# Patient Record
Sex: Female | Born: 1945 | Marital: Married | Smoking: Never smoker
Health system: Southern US, Community
[De-identification: ages and names within clinical notes are randomized; demographics above are authoritative.]

## PROBLEM LIST (undated history)

## (undated) DIAGNOSIS — H409 Unspecified glaucoma: Secondary | ICD-10-CM

## (undated) DIAGNOSIS — H3530 Unspecified macular degeneration: Secondary | ICD-10-CM

## (undated) DIAGNOSIS — M1990 Unspecified osteoarthritis, unspecified site: Secondary | ICD-10-CM

## (undated) HISTORY — PX: COLONOSCOPY: SHX174

## (undated) HISTORY — PX: EYE SURGERY: SHX253

---

## 2004-12-28 ENCOUNTER — Ambulatory Visit: Payer: Self-pay | Admitting: Unknown Physician Specialty

## 2006-03-14 ENCOUNTER — Ambulatory Visit: Payer: Self-pay | Admitting: Unknown Physician Specialty

## 2006-03-20 ENCOUNTER — Ambulatory Visit: Payer: Self-pay | Admitting: Unknown Physician Specialty

## 2006-04-05 ENCOUNTER — Ambulatory Visit: Payer: Self-pay | Admitting: Gastroenterology

## 2006-09-23 ENCOUNTER — Ambulatory Visit: Payer: Self-pay | Admitting: Unknown Physician Specialty

## 2007-03-25 ENCOUNTER — Ambulatory Visit: Payer: Self-pay | Admitting: Unknown Physician Specialty

## 2007-11-28 ENCOUNTER — Ambulatory Visit: Payer: Self-pay | Admitting: Family Medicine

## 2008-02-06 ENCOUNTER — Ambulatory Visit: Payer: Self-pay | Admitting: Family Medicine

## 2008-03-25 ENCOUNTER — Ambulatory Visit: Payer: Self-pay | Admitting: Unknown Physician Specialty

## 2009-03-31 ENCOUNTER — Ambulatory Visit: Payer: Self-pay | Admitting: Unknown Physician Specialty

## 2010-04-05 ENCOUNTER — Ambulatory Visit: Payer: Self-pay | Admitting: Unknown Physician Specialty

## 2011-04-12 ENCOUNTER — Ambulatory Visit: Payer: Self-pay | Admitting: Unknown Physician Specialty

## 2011-05-21 ENCOUNTER — Ambulatory Visit: Payer: Self-pay | Admitting: Gastroenterology

## 2011-05-24 LAB — PATHOLOGY REPORT

## 2011-08-31 ENCOUNTER — Ambulatory Visit: Payer: Self-pay | Admitting: Ophthalmology

## 2011-08-31 DIAGNOSIS — I499 Cardiac arrhythmia, unspecified: Secondary | ICD-10-CM

## 2011-09-10 ENCOUNTER — Ambulatory Visit: Payer: Self-pay | Admitting: Ophthalmology

## 2012-06-11 ENCOUNTER — Ambulatory Visit: Payer: Self-pay | Admitting: Ophthalmology

## 2012-06-23 ENCOUNTER — Ambulatory Visit: Payer: Self-pay | Admitting: Ophthalmology

## 2015-02-22 NOTE — Op Note (Signed)
PATIENT NAME:  Traci Reyes, Traci Reyes MR#:  [id number] DATE OF BIRTH:  February 01, 1946  DATE OF PROCEDURE:  06/23/2012  PREOPERATIVE DIAGNOSIS:  Cataract, right eye.  POSTOPERATIVE DIAGNOSIS:  Cataract, right eye.  PROCEDURE PERFORMED:  Extracapsular cataract extraction using phacoemulsification with placement of an Alcon [id number], 18.5-diopter posterior chamber lens, serial # [id number].  SURGEON:  Loura Back. Langley Ingalls, MD  ASSISTANT:  None.  ANESTHESIA:  4% lidocaine and 0.75% Marcaine in a 50/50 mixture with 10 units/mL of Hylenex added, given as a peribulbar.  ANESTHESIOLOGIST:  Dr. Benjamine Mola  COMPLICATIONS:  None.  ESTIMATED BLOOD LOSS:  Less than 1 mL.  DESCRIPTION OF PROCEDURE:  The patient was brought to the operating room and given a peribulbar block.  The patient was then prepped and draped in the usual fashion.  The vertical rectus muscles were imbricated using 5-0 silk sutures.  These sutures were then clamped to the sterile drapes as bridle sutures.  A limbal peritomy was performed extending two clock hours and hemostasis was obtained with cautery.  A partial thickness scleral groove was made at the surgical limbus and then dissected anteriorly in a lamellar dissection with using an Alcon crescent knife.  The anterior chamber was entered superonasally with a Superblade and through the lamellar dissection with a 2.6-mm keratome.  DisCoVisc was used to replace the aqueous and a continuous tear capsulorrhexis was carried out.  Hydrodissection and hydrodelineation were carried out with balanced salt and a 27 gauge canula.  The nucleus was rotated to confirm the effectiveness of the hydrodissection.  Phacoemulsification was carried out using a divide-and-conquer technique.  Total ultrasound time was 1 minute and 36.2 seconds with an average power of 17.8 percent. CDE 34.16.  Irrigation/aspiration was used to remove the residual cortex.  DisCoVisc was used to inflate the capsule and the internal  wound was enlarged to 3 mm with the crescent knife.  The intraocular lens was inserted into the capsular bag using the AcrySert delivery system.  Irrigation/aspiration was used to remove the residual DisCoVisc.  Miostat was injected into the anterior chamber through the paracentesis track to inflate the anterior chamber and induce miosis.  The wound was checked for leaks and wound leakage was found.  A single 10-0 suture was placed across the wound, tied and the knot was rotated superiorly.  The conjunctiva was closed with cautery and the bridle sutures were removed.  Two drops of 0.3% Vigamox were placed on the eye.  An eye shield was placed on the eye.  The patient was discharged to the recovery room in good condition.  ____________________________ Loura Back Charrise Lardner, MD sad:cms D: 06/23/2012 13:46:23 ET T: 06/23/2012 14:07:16 ET JOB#: [id number]  cc: Remo Lipps A. Kaikoa Magro, MD, <Dictator> Martie Lee MD ELECTRONICALLY SIGNED 06/30/2012 13:43

## 2016-07-20 DIAGNOSIS — D126 Benign neoplasm of colon, unspecified: Secondary | ICD-10-CM | POA: Insufficient documentation

## 2016-08-20 ENCOUNTER — Other Ambulatory Visit: Payer: Self-pay | Admitting: Family Medicine

## 2016-08-20 DIAGNOSIS — Z1231 Encounter for screening mammogram for malignant neoplasm of breast: Secondary | ICD-10-CM

## 2016-09-17 ENCOUNTER — Ambulatory Visit
Admission: RE | Admit: 2016-09-17 | Discharge: 2016-09-17 | Disposition: A | Discharge status: Home / Self Care | Payer: Medicare Other | Source: Ambulatory Visit | Attending: Family Medicine | Admitting: Family Medicine

## 2016-09-17 ENCOUNTER — Encounter: Payer: Self-pay | Admitting: Radiology

## 2016-09-17 DIAGNOSIS — Z1231 Encounter for screening mammogram for malignant neoplasm of breast: Secondary | ICD-10-CM | POA: Diagnosis not present

## 2016-11-08 ENCOUNTER — Encounter: Payer: Self-pay | Admitting: *Deleted

## 2016-11-09 ENCOUNTER — Ambulatory Visit: Payer: Medicare Other | Admitting: Anesthesiology

## 2016-11-09 ENCOUNTER — Ambulatory Visit
Admission: RE | Admit: 2016-11-09 | Discharge: 2016-11-09 | Disposition: A | Discharge status: Home / Self Care | Payer: Medicare Other | Source: Ambulatory Visit | Attending: Gastroenterology | Admitting: Gastroenterology

## 2016-11-09 ENCOUNTER — Encounter
Admission: RE | Disposition: A | Discharge status: Home / Self Care | Payer: Self-pay | Source: Ambulatory Visit | Attending: Gastroenterology

## 2016-11-09 DIAGNOSIS — Z7982 Long term (current) use of aspirin: Secondary | ICD-10-CM | POA: Diagnosis not present

## 2016-11-09 DIAGNOSIS — K5730 Diverticulosis of large intestine without perforation or abscess without bleeding: Secondary | ICD-10-CM | POA: Diagnosis not present

## 2016-11-09 DIAGNOSIS — K640 First degree hemorrhoids: Secondary | ICD-10-CM | POA: Insufficient documentation

## 2016-11-09 DIAGNOSIS — D122 Benign neoplasm of ascending colon: Secondary | ICD-10-CM | POA: Insufficient documentation

## 2016-11-09 DIAGNOSIS — M1990 Unspecified osteoarthritis, unspecified site: Secondary | ICD-10-CM | POA: Diagnosis not present

## 2016-11-09 DIAGNOSIS — Z1211 Encounter for screening for malignant neoplasm of colon: Secondary | ICD-10-CM | POA: Diagnosis present

## 2016-11-09 DIAGNOSIS — Z86010 Personal history of colonic polyps: Secondary | ICD-10-CM | POA: Insufficient documentation

## 2016-11-09 DIAGNOSIS — H3530 Unspecified macular degeneration: Secondary | ICD-10-CM | POA: Diagnosis not present

## 2016-11-09 DIAGNOSIS — H409 Unspecified glaucoma: Secondary | ICD-10-CM | POA: Insufficient documentation

## 2016-11-09 DIAGNOSIS — Z888 Allergy status to other drugs, medicaments and biological substances status: Secondary | ICD-10-CM | POA: Diagnosis not present

## 2016-11-09 HISTORY — DX: Unspecified macular degeneration: H35.30

## 2016-11-09 HISTORY — DX: Unspecified osteoarthritis, unspecified site: M19.90

## 2016-11-09 HISTORY — DX: Unspecified glaucoma: H40.9

## 2016-11-09 HISTORY — PX: COLONOSCOPY WITH PROPOFOL: SHX5780

## 2016-11-09 SURGERY — COLONOSCOPY WITH PROPOFOL
Anesthesia: General

## 2016-11-09 MED ORDER — SODIUM CHLORIDE 0.9 % IV SOLN
INTRAVENOUS | Status: DC
  Administered 2016-11-09: 07:00:00 via INTRAVENOUS

## 2016-11-09 MED ORDER — PROPOFOL 10 MG/ML IV BOLUS
INTRAVENOUS | Status: AC
  Filled 2016-11-09: qty 20

## 2016-11-09 MED ORDER — PROPOFOL 10 MG/ML IV BOLUS
INTRAVENOUS | Status: DC | PRN
  Administered 2016-11-09: 40 mg via INTRAVENOUS

## 2016-11-09 MED ORDER — SODIUM CHLORIDE 0.9 % IV SOLN: INTRAVENOUS | Status: DC

## 2016-11-09 MED ORDER — PROPOFOL 500 MG/50ML IV EMUL
INTRAVENOUS | Status: DC | PRN
  Administered 2016-11-09: 120 ug/kg/min via INTRAVENOUS

## 2016-11-09 MED ORDER — LIDOCAINE HCL (CARDIAC) 10 MG/ML IV SOLN
INTRAVENOUS | Status: DC | PRN
  Administered 2016-11-09: 3 mL via INTRAVENOUS

## 2016-11-09 MED ORDER — ESMOLOL HCL 100 MG/10ML IV SOLN
INTRAVENOUS | Status: AC
  Filled 2016-11-09: qty 10

## 2016-11-09 MED ORDER — LIDOCAINE HCL 2 % EX GEL
CUTANEOUS | Status: AC
  Filled 2016-11-09: qty 5

## 2016-11-09 MED ORDER — ESMOLOL HCL 100 MG/10ML IV SOLN
INTRAVENOUS | Status: DC | PRN
  Administered 2016-11-09: 30 mg via INTRAVENOUS

## 2016-11-09 NOTE — Op Note (Addendum)
[facility] Patient Name: Margueritte Utterback Procedure Date: 11/09/2016 7:41 AM MRN: [id number] Account #: [id number] Date of Birth: 1946-05-28 Admit Type: Outpatient Age: 71 Room: [facility] ENDO ROOM 1 Gender: Female Note Status: Finalized Procedure:            Colonoscopy Indications:          Personal history of colonic polyps Providers:            Lollie Sails, MD Referring MD:         Youlanda Roys. Lovie Macadamia, MD (Referring MD) Medicines:            Monitored Anesthesia Care Complications:        No immediate complications. Procedure:            Pre-Anesthesia Assessment:                       - ASA Grade Assessment: II - A patient with mild                        systemic disease.                       After obtaining informed consent, the colonoscope was                        passed under direct vision. Throughout the procedure,                        the patient's blood pressure, pulse, and oxygen                        saturations were monitored continuously. The                        Colonoscope was introduced through the anus and                        advanced to the the cecum, identified by appendiceal                        orifice and ileocecal valve. The colonoscopy was                        performed with moderate difficulty due to significant                        looping and a tortuous colon. Successful completion of                        the procedure was aided by using manual pressure. The                        patient tolerated the procedure well. The quality of                        the bowel preparation was good. Findings:      A 2 mm polyp was found in the distal ascending colon. The polyp was       sessile. The polyp was removed with a cold biopsy forceps. Resection and       retrieval  were complete.      A few small-mouthed diverticula were found in the sigmoid colon and       distal descending colon.      Non-bleeding  internal hemorrhoids were found during anoscopy. The       hemorrhoids were small and Grade I (internal hemorrhoids that do not       prolapse).      The digital rectal exam was normal.      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 Impression:           - One 2 mm polyp in the distal ascending colon, removed                        with a cold biopsy forceps. Resected and retrieved.                       - Diverticulosis in the sigmoid colon and in the distal                        descending colon.                       - Non-bleeding internal hemorrhoids. Recommendation:       - Discharge patient to home.                       - Await pathology results. Procedure Code(s):    --- Professional ---                       [phone], Colonoscopy, flexible; with biopsy, single or                        multiple Diagnosis Code(s):    --- Professional ---                       D12.2, Benign neoplasm of ascending colon                       K64.0, First degree hemorrhoids                       Z86.010, Personal history of colonic polyps                       K57.30, Diverticulosis of large intestine without                        perforation or abscess without bleeding CPT copyright 2016 American Medical Association. All rights reserved. The codes documented in this report are preliminary and upon coder review may  be revised to meet current compliance requirements. Lollie Sails, MD 11/09/2016 8:23:32 AM This report has been signed electronically. Number of Addenda: 0 Note Initiated On: 11/09/2016 7:41 AM Scope Withdrawal Time: 0 hours 11 minutes 17 seconds  Total Procedure Duration: 0 hours 30 minutes 33 seconds       [facility]

## 2016-11-09 NOTE — Anesthesia Preprocedure Evaluation (Signed)
Anesthesia Evaluation  Patient identified by MRN, date of birth, ID band Patient awake    Reviewed: Allergy & Precautions, NPO status , Patient's Chart, lab work & pertinent test results  Airway Mallampati: II       Dental  (+) Teeth Intact   Pulmonary neg pulmonary ROS,    breath sounds clear to auscultation       Cardiovascular Exercise Tolerance: Good negative cardio ROS   Rhythm:Regular Rate:Tachycardia     Neuro/Psych negative neurological ROS     GI/Hepatic negative GI ROS, Neg liver ROS,   Endo/Other  negative endocrine ROS  Renal/GU negative Renal ROS     Musculoskeletal   Abdominal Normal abdominal exam  (+)   Peds negative pediatric ROS (+)  Hematology   Anesthesia Other Findings   Reproductive/Obstetrics                             Anesthesia Physical Anesthesia Plan  ASA: II  Anesthesia Plan: General   Post-op Pain Management:    Induction: Intravenous  Airway Management Planned: Natural Airway and Nasal Cannula  Additional Equipment:   Intra-op Plan:   Post-operative Plan:   Informed Consent: I have reviewed the patients History and Physical, chart, labs and discussed the procedure including the risks, benefits and alternatives for the proposed anesthesia with the patient or authorized representative who has indicated his/her understanding and acceptance.     Plan Discussed with: Surgeon  Anesthesia Plan Comments:         Anesthesia Quick Evaluation

## 2016-11-09 NOTE — Transfer of Care (Signed)
Immediate Anesthesia Transfer of Care Note  Patient: Traci Reyes  Procedure(s) Performed: Procedure(s): COLONOSCOPY WITH PROPOFOL (N/A)  Patient Location: PACU  Anesthesia Type:General  Level of Consciousness: awake  Airway & Oxygen Therapy: Patient Spontanous Breathing and Patient connected to nasal cannula oxygen  Post-op Assessment: Report given to RN and Post -op Vital signs reviewed and stable  Post vital signs: Reviewed  Last Vitals:  Vitals:   11/09/16 0658  BP: (!) 144/64  Pulse: 64  Resp: 20  Temp: (!) 35.6 C    Last Pain:  Vitals:   11/09/16 0658  TempSrc: Tympanic         Complications: No apparent anesthesia complications

## 2016-11-09 NOTE — H&P (Signed)
Outpatient short stay form Pre-procedure 11/09/2016 7:40 AM Lollie Sails MD  Primary Physician: Dr. [name]  Reason for visit:  Colonoscopy  History of present illness:  Patient is a 71 year old female presenting today as above. Colonoscopy was in 2012 that showed an adenomatous colon polyp. She tolerated her prep well. She does take a daily aspirin but has held that for at least several days. She takes no other blood thinning agents or aspirin products.    Current Facility-Administered Medications:  .  0.9 %  sodium chloride infusion, , Intravenous, Continuous, Lollie Sails, MD, Last Rate: 20 mL/hr at 11/09/16 0728 .  0.9 %  sodium chloride infusion, , Intravenous, Continuous, Lollie Sails, MD  Prescriptions Prior to Admission  Medication Sig Dispense Refill Last Dose  . aspirin EC 81 MG tablet Take 81 mg by mouth daily.   Past Week at Unknown time  . bimatoprost (LUMIGAN) 0.01 % SOLN Place 1 drop into both eyes at bedtime.   11/08/2016 at Unknown time  . Calcium Carbonate-Vitamin D (CALCIUM 500 + D) 500-125 MG-UNIT TABS Take 1 tablet by mouth daily.   Past Week at Unknown time  . Cholecalciferol 10000 units TABS Take 1 tablet by mouth daily.   Past Week at Unknown time  . Multiple Vitamins-Minerals (PRESERVISION AREDS) CAPS Take by mouth 2 (two) times daily.   11/09/2016 at Unknown time  . timolol (TIMOPTIC) 0.5 % ophthalmic solution Place 1 drop into both eyes 2 (two) times daily.   11/08/2016 at Unknown time  . dorzolamide-timolol (COSOPT) 22.3-6.8 MG/ML ophthalmic solution Place 1 drop into both eyes 2 (two) times daily.   Not Taking at Unknown time  . loratadine (CLARITIN) 10 MG tablet Take 10 mg by mouth daily.   Not Taking at Unknown time     Allergies  Allergen Reactions  . Cosopt [Dorzolamide Hcl-Timolol Mal] Swelling     Past Medical History:  Diagnosis Date  . Arthritis   . Glaucoma   . Macular degeneration disease     Review of systems:       Physical Exam    Heart and lungs: Regular rate and rhythm without rub or gallop, lungs are bilaterally clear.    HEENT: Normocephalic atraumatic eyes are anicteric    Other:     Pertinant exam for procedure: Soft nontender nondistended bowel sounds positive normoactive.    Planned proceedures: Colonoscopy I have discussed the risks benefits and complications of procedures to include not limited to bleeding, infection, perforation and the risk of sedation and the patient wishes to proceed. and indicated procedures.    Lollie Sails, MD Gastroenterology 11/09/2016  7:40 AM

## 2016-11-09 NOTE — Anesthesia Postprocedure Evaluation (Signed)
Anesthesia Post Note  Patient: Traci Reyes  Procedure(s) Performed: Procedure(s) (LRB): COLONOSCOPY WITH PROPOFOL (N/A)  Patient location during evaluation: PACU Anesthesia Type: General Level of consciousness: awake Pain management: satisfactory to patient Vital Signs Assessment: post-procedure vital signs reviewed and stable Respiratory status: spontaneous breathing Cardiovascular status: stable Anesthetic complications: no     Last Vitals:  Vitals:   11/09/16 0840 11/09/16 0850  BP: 133/88 126/66  Pulse:  (!) 59  Resp:  (!) 26  Temp:      Last Pain:  Vitals:   11/09/16 0820  TempSrc: Tympanic                 VAN STAVEREN,Melquiades Kovar

## 2016-11-12 LAB — SURGICAL PATHOLOGY

## 2017-08-19 ENCOUNTER — Other Ambulatory Visit: Payer: Self-pay | Admitting: Family Medicine

## 2017-08-19 DIAGNOSIS — Z1231 Encounter for screening mammogram for malignant neoplasm of breast: Secondary | ICD-10-CM

## 2017-09-25 ENCOUNTER — Ambulatory Visit
Admission: RE | Admit: 2017-09-25 | Discharge: 2017-09-25 | Disposition: A | Discharge status: Home / Self Care | Payer: Medicare Other | Source: Ambulatory Visit | Attending: Family Medicine | Admitting: Family Medicine

## 2017-09-25 DIAGNOSIS — Z1231 Encounter for screening mammogram for malignant neoplasm of breast: Secondary | ICD-10-CM | POA: Diagnosis present

## 2018-08-18 ENCOUNTER — Other Ambulatory Visit: Payer: Self-pay | Admitting: Family Medicine

## 2018-08-18 DIAGNOSIS — Z1231 Encounter for screening mammogram for malignant neoplasm of breast: Secondary | ICD-10-CM

## 2018-09-26 ENCOUNTER — Ambulatory Visit
Admission: RE | Admit: 2018-09-26 | Discharge: 2018-09-26 | Disposition: A | Discharge status: Home / Self Care | Payer: Medicare Other | Source: Ambulatory Visit | Attending: Family Medicine | Admitting: Family Medicine

## 2018-09-26 DIAGNOSIS — Z1231 Encounter for screening mammogram for malignant neoplasm of breast: Secondary | ICD-10-CM | POA: Diagnosis not present

## 2018-11-24 ENCOUNTER — Ambulatory Visit (INDEPENDENT_AMBULATORY_CARE_PROVIDER_SITE_OTHER): Payer: Medicare Other | Admitting: Obstetrics and Gynecology

## 2018-11-24 ENCOUNTER — Other Ambulatory Visit (HOSPITAL_COMMUNITY)
Admission: RE | Admit: 2018-11-24 | Discharge: 2018-11-24 | Disposition: A | Discharge status: Home / Self Care | Payer: Medicare Other | Source: Ambulatory Visit | Attending: Obstetrics and Gynecology | Admitting: Obstetrics and Gynecology

## 2018-11-24 ENCOUNTER — Encounter: Payer: Self-pay | Admitting: Obstetrics and Gynecology

## 2018-11-24 VITALS — BP 130/70 | HR 71 | Ht 65.0 in | Wt 148.0 lb

## 2018-11-24 DIAGNOSIS — Z124 Encounter for screening for malignant neoplasm of cervix: Secondary | ICD-10-CM | POA: Diagnosis present

## 2018-11-24 DIAGNOSIS — Z01419 Encounter for gynecological examination (general) (routine) without abnormal findings: Secondary | ICD-10-CM | POA: Diagnosis not present

## 2018-11-24 DIAGNOSIS — Z808 Family history of malignant neoplasm of other organs or systems: Secondary | ICD-10-CM | POA: Insufficient documentation

## 2018-11-24 DIAGNOSIS — N760 Acute vaginitis: Secondary | ICD-10-CM

## 2018-11-24 DIAGNOSIS — Z1239 Encounter for other screening for malignant neoplasm of breast: Secondary | ICD-10-CM

## 2018-11-24 NOTE — Progress Notes (Signed)
PCP: [name], MD   Chief Complaint  Patient presents with  . Gynecologic Exam    HPI:      Ms. Traci Reyes is a 73 y.o. No obstetric history on file. who LMP was No LMP recorded. Patient is postmenopausal., presents today for her annual examination.  Her menses are absent due to menopause.  She does not have intermenstrual bleeding. She does not have vasomotor sx.   Sex activity: single partner, contraception - post menopausal status. She does have vaginal dryness and uses lubricants with sx relief. Pt denies any vaginal itching/irritation. Occas has urin incont.  Last Pap: August 17, 2016  Results were: no abnormalities   Last mammogram: September 26, 2018  Results were: normal--routine follow-up in 12 months There is a FH of breast cancer in her pat aunt, genetic testing not done. There is no FH of ovarian cancer. Her daughter recently diagnosed with melanoma. The patient does do self-breast exams.  Colonoscopy: 1/18 with Dr. Gustavo Lah with polyps;  Repeat due after 3 or 5 years, per pt.   Tobacco use: The patient denies current or previous tobacco use. Alcohol use: none Exercise: moderately active  She does get adequate calcium and Vitamin D in her diet.  Labs/DEXA with PCP.   Past Medical History:  Diagnosis Date  . Arthritis   . Glaucoma   . Macular degeneration disease     Past Surgical History:  Procedure Laterality Date  . COLONOSCOPY    . COLONOSCOPY WITH PROPOFOL N/A 11/09/2016   Procedure: COLONOSCOPY WITH PROPOFOL;  Surgeon: Lollie Sails, MD;  Location[facility] ENDOSCOPY;  Service: Endoscopy;  Laterality: N/A;  . EYE SURGERY      Family History  Problem Relation Age of Onset  . Breast cancer Paternal Aunt        ? 3s  . Heart disease Mother   . Colon cancer Father 35  . Hypertension Father   . Heart disease Sister   . Hypertension Sister   . Colon cancer Paternal Grandfather   . Brain cancer Paternal Uncle 49  . Melanoma Daughter      Social History   Socioeconomic History  . Marital status: Married    Spouse name: Not on file  . Number of children: Not on file  . Years of education: Not on file  . Highest education level: Not on file  Occupational History  . Not on file  Social Needs  . Financial resource strain: Not on file  . Food insecurity:    Worry: Not on file    Inability: Not on file  . Transportation needs:    Medical: Not on file    Non-medical: Not on file  Tobacco Use  . Smoking status: Never Smoker  . Smokeless tobacco: Never Used  Substance and Sexual Activity  . Alcohol use: No  . Drug use: No  . Sexual activity: Yes    Birth control/protection: Post-menopausal  Lifestyle  . Physical activity:    Days per week: Not on file    Minutes per session: Not on file  . Stress: Not on file  Relationships  . Social connections:    Talks on phone: Not on file    Gets together: Not on file    Attends religious service: Not on file    Active member of club or organization: Not on file    Attends meetings of clubs or organizations: Not on file    Relationship status: Not on file  .  Intimate partner violence:    Fear of current or ex partner: Not on file    Emotionally abused: Not on file    Physically abused: Not on file    Forced sexual activity: Not on file  Other Topics Concern  . Not on file  Social History Narrative  . Not on file    Outpatient Medications Prior to Visit  Medication Sig Dispense Refill  . bimatoprost (LUMIGAN) 0.01 % SOLN Place 1 drop into both eyes at bedtime.    . Calcium Carbonate-Vitamin D (CALCIUM 500 + D) 500-125 MG-UNIT TABS Take 1 tablet by mouth daily.    . Cholecalciferol 10000 units TABS Take 1 tablet by mouth daily.    . dorzolamide-timolol (COSOPT) 22.3-6.8 MG/ML ophthalmic solution Place 1 drop into both eyes 2 (two) times daily.    Marland Kitchen loratadine (CLARITIN) 10 MG tablet Take 10 mg by mouth daily.    . Multiple Vitamins-Minerals (PRESERVISION AREDS)  CAPS Take by mouth 2 (two) times daily.    Marland Kitchen aspirin EC 81 MG tablet Take 81 mg by mouth daily.    . timolol (TIMOPTIC) 0.5 % ophthalmic solution Place 1 drop into both eyes 2 (two) times daily.     No facility-administered medications prior to visit.        ROS:  Review of Systems  Constitutional: Negative for fatigue, fever and unexpected weight change.  Respiratory: Positive for cough. Negative for shortness of breath and wheezing.   Cardiovascular: Negative for chest pain, palpitations and leg swelling.  Gastrointestinal: Negative for blood in stool, constipation, diarrhea, nausea and vomiting.  Endocrine: Negative for cold intolerance, heat intolerance and polyuria.  Genitourinary: Negative for dyspareunia, dysuria, flank pain, frequency, genital sores, hematuria, menstrual problem, pelvic pain, urgency, vaginal bleeding, vaginal discharge and vaginal pain.  Musculoskeletal: Negative for back pain, joint swelling and myalgias.  Skin: Negative for rash.  Allergic/Immunologic: Positive for environmental allergies. Negative for food allergies and immunocompromised state.  Neurological: Negative for dizziness, syncope, light-headedness, numbness and headaches.  Hematological: Negative for adenopathy.  Psychiatric/Behavioral: Positive for agitation. Negative for confusion, sleep disturbance and suicidal ideas. The patient is not nervous/anxious.    BREAST: No symptoms    Objective: BP 130/70   Pulse 71   Ht 5\' 5"  (1.651 m)   Wt 148 lb (67.1 kg)   BMI 24.63 kg/m    Physical Exam Constitutional:      Appearance: She is well-developed.  Genitourinary:     Vagina, cervix, uterus, right adnexa and left adnexa normal.     No vulval lesion or tenderness noted.     No vaginal discharge, erythema or tenderness.     No cervical polyp.     Uterus is not enlarged or tender.     No right or left adnexal mass present.     Right adnexa not tender.     Left adnexa not tender.      Genitourinary Comments: EXT ERYTHEMA BILAT LABIA MINORA/PERINEAL AREA/PERIANAL AREA; NO SCALE; PT DENIES SX  Neck:     Musculoskeletal: Normal range of motion.     Thyroid: No thyromegaly.  Cardiovascular:     Rate and Rhythm: Normal rate and regular rhythm.     Heart sounds: Normal heart sounds. No murmur.  Pulmonary:     Effort: Pulmonary effort is normal.     Breath sounds: Normal breath sounds.  Chest:     Breasts:        Right: No mass, nipple discharge, skin  change or tenderness.        Left: No mass, nipple discharge, skin change or tenderness.  Abdominal:     Palpations: Abdomen is soft.     Tenderness: There is no abdominal tenderness. There is no guarding.  Musculoskeletal: Normal range of motion.  Neurological:     General: No focal deficit present.     Mental Status: She is alert and oriented to person, place, and time.     Cranial Nerves: No cranial nerve deficit.  Skin:    General: Skin is warm and dry.  Psychiatric:        Mood and Affect: Mood normal.        Behavior: Behavior normal.        Thought Content: Thought content normal.        Judgment: Judgment normal.  Vitals signs reviewed.    Assessment/Plan:  Encounter for annual routine gynecological examination  Cervical cancer screening - Plan: Cytology - PAP  Screening for breast cancer - Pt current on mammo. Done through PCP  Family history of melanoma - Pt doesn't qualify for cancer genetic testing with Medicare but has FH breast/colon cancer too  Acute vaginitis - Pos exam, no sx per pt. Can use OTC lotrimin prn. Keep dry. F/u prn.         GYN counsel breast self exam, mammography screening, menopause, adequate intake of calcium and vitamin D, diet and exercise    F/U  Return if symptoms worsen or fail to improve./pt no longer needs GYN exams due to age/no hx of cx dysplasia.   Shamarcus Hoheisel B. Liboria Putnam, PA-C 11/24/2018 1:58 PM

## 2018-11-24 NOTE — Patient Instructions (Signed)
I value your feedback and entrusting us with your care. If you get a [place] patient survey, I would appreciate you taking the time to let us know about your experience today. Thank you! 

## 2018-11-27 LAB — CYTOLOGY - PAP: DIAGNOSIS: NEGATIVE

## 2019-09-07 ENCOUNTER — Other Ambulatory Visit: Payer: Self-pay | Admitting: Family Medicine

## 2019-09-07 DIAGNOSIS — Z1231 Encounter for screening mammogram for malignant neoplasm of breast: Secondary | ICD-10-CM

## 2019-09-30 ENCOUNTER — Ambulatory Visit
Admission: RE | Admit: 2019-09-30 | Discharge: 2019-09-30 | Disposition: A | Discharge status: Home / Self Care | Payer: Medicare Other | Source: Ambulatory Visit | Attending: Family Medicine | Admitting: Family Medicine

## 2019-09-30 DIAGNOSIS — Z1231 Encounter for screening mammogram for malignant neoplasm of breast: Secondary | ICD-10-CM | POA: Diagnosis not present

## 2020-08-29 ENCOUNTER — Other Ambulatory Visit: Payer: Self-pay | Admitting: Family Medicine

## 2020-08-29 DIAGNOSIS — Z1231 Encounter for screening mammogram for malignant neoplasm of breast: Secondary | ICD-10-CM

## 2020-10-05 ENCOUNTER — Ambulatory Visit
Admission: RE | Admit: 2020-10-05 | Discharge: 2020-10-05 | Disposition: A | Discharge status: Home / Self Care | Payer: Medicare Other | Source: Ambulatory Visit | Attending: Family Medicine | Admitting: Family Medicine

## 2020-10-05 ENCOUNTER — Other Ambulatory Visit: Payer: Self-pay

## 2020-10-05 DIAGNOSIS — Z1231 Encounter for screening mammogram for malignant neoplasm of breast: Secondary | ICD-10-CM | POA: Diagnosis not present

## 2020-10-21 DIAGNOSIS — J309 Allergic rhinitis, unspecified: Secondary | ICD-10-CM | POA: Insufficient documentation

## 2020-10-21 DIAGNOSIS — H3530 Unspecified macular degeneration: Secondary | ICD-10-CM | POA: Insufficient documentation

## 2020-10-21 DIAGNOSIS — H409 Unspecified glaucoma: Secondary | ICD-10-CM | POA: Insufficient documentation

## 2021-10-02 ENCOUNTER — Other Ambulatory Visit: Payer: Self-pay | Admitting: Family Medicine

## 2021-10-02 DIAGNOSIS — Z1231 Encounter for screening mammogram for malignant neoplasm of breast: Secondary | ICD-10-CM

## 2021-10-31 ENCOUNTER — Other Ambulatory Visit: Payer: Self-pay

## 2021-10-31 ENCOUNTER — Ambulatory Visit
Admission: RE | Admit: 2021-10-31 | Discharge: 2021-10-31 | Disposition: A | Discharge status: Home / Self Care | Payer: Medicare Other | Source: Ambulatory Visit | Attending: Family Medicine | Admitting: Family Medicine

## 2021-10-31 DIAGNOSIS — Z1231 Encounter for screening mammogram for malignant neoplasm of breast: Secondary | ICD-10-CM | POA: Insufficient documentation

## 2022-05-17 ENCOUNTER — Encounter: Payer: Self-pay | Admitting: *Deleted

## 2022-05-18 ENCOUNTER — Encounter
Admission: RE | Disposition: A | Discharge status: Home / Self Care | Payer: Self-pay | Source: Ambulatory Visit | Attending: Gastroenterology

## 2022-05-18 ENCOUNTER — Ambulatory Visit: Payer: Medicare Other | Admitting: Anesthesiology

## 2022-05-18 ENCOUNTER — Ambulatory Visit
Admission: RE | Admit: 2022-05-18 | Discharge: 2022-05-18 | Disposition: A | Discharge status: Home / Self Care | Payer: Medicare Other | Source: Ambulatory Visit | Attending: Gastroenterology | Admitting: Gastroenterology

## 2022-05-18 DIAGNOSIS — K640 First degree hemorrhoids: Secondary | ICD-10-CM | POA: Diagnosis not present

## 2022-05-18 DIAGNOSIS — D123 Benign neoplasm of transverse colon: Secondary | ICD-10-CM | POA: Diagnosis not present

## 2022-05-18 DIAGNOSIS — Z800 Family history of malignant neoplasm of digestive organs: Secondary | ICD-10-CM | POA: Diagnosis not present

## 2022-05-18 DIAGNOSIS — Z86010 Personal history of colonic polyps: Secondary | ICD-10-CM | POA: Insufficient documentation

## 2022-05-18 DIAGNOSIS — Z1211 Encounter for screening for malignant neoplasm of colon: Secondary | ICD-10-CM | POA: Diagnosis present

## 2022-05-18 HISTORY — PX: COLONOSCOPY WITH PROPOFOL: SHX5780

## 2022-05-18 SURGERY — COLONOSCOPY WITH PROPOFOL
Anesthesia: General

## 2022-05-18 MED ORDER — SODIUM CHLORIDE 0.9 % IV SOLN
INTRAVENOUS | Status: DC
  Administered 2022-05-18: 20 mL/h via INTRAVENOUS

## 2022-05-18 MED ORDER — LIDOCAINE HCL (CARDIAC) PF 100 MG/5ML IV SOSY
PREFILLED_SYRINGE | INTRAVENOUS | Status: DC | PRN
  Administered 2022-05-18: 50 mg via INTRAVENOUS

## 2022-05-18 MED ORDER — PROPOFOL 10 MG/ML IV BOLUS
INTRAVENOUS | Status: DC | PRN
  Administered 2022-05-18: 70 mg via INTRAVENOUS

## 2022-05-18 MED ORDER — PROPOFOL 500 MG/50ML IV EMUL
INTRAVENOUS | Status: DC | PRN
  Administered 2022-05-18: 150 ug/kg/min via INTRAVENOUS

## 2022-05-18 NOTE — Transfer of Care (Signed)
Immediate Anesthesia Transfer of Care Note  Patient: Traci Reyes  Procedure(s) Performed: COLONOSCOPY WITH PROPOFOL  Patient Location: PACU  Anesthesia Type:General  Level of Consciousness: awake and alert   Airway & Oxygen Therapy: Patient Spontanous Breathing  Post-op Assessment: Report given to RN and Post -op Vital signs reviewed and stable  Post vital signs: Reviewed and stable  Last Vitals:  Vitals Value Taken Time  BP 121/56 05/18/22 1338  Temp    Pulse 65 05/18/22 1339  Resp 16 05/18/22 1339  SpO2 97 % 05/18/22 1339  Vitals shown include unvalidated device data.  Last Pain:  Vitals:   05/18/22 1234  TempSrc: Temporal  PainSc: 0-No pain         Complications: No notable events documented.

## 2022-05-18 NOTE — H&P (Signed)
Outpatient short stay form Pre-procedure 05/18/2022  Traci Rubenstein, MD  Primary Physician: Traci Pitch, MD  Reason for visit:  Surveillance colonoscopy  History of present illness:    76 y/o lady with history of small TA on last colonoscopy 5 years ago here for surveillance colonoscopy. No blood thinners. Father with colon cancer in his 29's. No significant abdominal surgeries.    Current Facility-Administered Medications:    0.9 %  sodium chloride infusion, , Intravenous, Continuous, Benz Vandenberghe, Hilton Cork, MD, Last Rate: 20 mL/hr at 05/18/22 1250, Restarted at 05/18/22 1256  Medications Prior to Admission  Medication Sig Dispense Refill Last Dose   bimatoprost (LUMIGAN) 0.01 % SOLN Place 1 drop into both eyes at bedtime.   Past Week   Calcium Carbonate-Vitamin D (CALCIUM 500 + D) 500-125 MG-UNIT TABS Take 1 tablet by mouth daily.   Past Week   Cholecalciferol 10000 units TABS Take 1 tablet by mouth daily.   Past Week   dorzolamide-timolol (COSOPT) 22.3-6.8 MG/ML ophthalmic solution Place 1 drop into both eyes 2 (two) times daily.   Past Week   loratadine (CLARITIN) 10 MG tablet Take 10 mg by mouth daily.   Past Week   Multiple Vitamins-Minerals (PRESERVISION AREDS) CAPS Take by mouth 2 (two) times daily.   Past Week     Allergies  Allergen Reactions   Cosopt [Dorzolamide Hcl-Timolol Mal] Swelling   Other Swelling    COSOPT EYE DROPS     Past Medical History:  Diagnosis Date   Arthritis    Glaucoma    Macular degeneration disease     Review of systems:  Otherwise negative.    Physical Exam  Gen: Alert, oriented. Appears stated age.  HEENT: PERRLA. Lungs: No respiratory distress CV: RRR Abd: soft, benign, no masses Ext: No edema    Planned procedures: Proceed with colonoscopy. The patient understands the nature of the planned procedure, indications, risks, alternatives and potential complications including but not limited to bleeding, infection,  perforation, damage to internal organs and possible oversedation/side effects from anesthesia. The patient agrees and gives consent to proceed.  Please refer to procedure notes for findings, recommendations and patient disposition/instructions.     Traci Rubenstein, MD [facility] Gastroenterology

## 2022-05-18 NOTE — Anesthesia Preprocedure Evaluation (Addendum)
Anesthesia Evaluation  Patient identified by MRN, date of birth, ID band Patient awake    Reviewed: Allergy & Precautions, NPO status , Patient's Chart, lab work & pertinent test results  History of Anesthesia Complications (+) PROLONGED EMERGENCE and history of anesthetic complications  Airway Mallampati: I   Neck ROM: Full    Dental no notable dental hx.    Pulmonary neg pulmonary ROS,    Pulmonary exam normal breath sounds clear to auscultation       Cardiovascular Exercise Tolerance: Good negative cardio ROS Normal cardiovascular exam Rhythm:Regular Rate:Normal     Neuro/Psych negative neurological ROS     GI/Hepatic negative GI ROS,   Endo/Other  negative endocrine ROS  Renal/GU negative Renal ROS     Musculoskeletal  (+) Arthritis ,   Abdominal   Peds  Hematology negative hematology ROS (+)   Anesthesia Other Findings   Reproductive/Obstetrics                            Anesthesia Physical Anesthesia Plan  ASA: 2  Anesthesia Plan: General   Post-op Pain Management:    Induction: Intravenous  PONV Risk Score and Plan: 3 and Propofol infusion, TIVA and Treatment may vary due to age or medical condition  Airway Management Planned: Natural Airway  Additional Equipment:   Intra-op Plan:   Post-operative Plan:   Informed Consent: I have reviewed the patients History and Physical, chart, labs and discussed the procedure including the risks, benefits and alternatives for the proposed anesthesia with the patient or authorized representative who has indicated his/her understanding and acceptance.       Plan Discussed with: CRNA  Anesthesia Plan Comments: (LMA/GETA backup discussed.  Patient consented for risks of anesthesia including but not limited to:  - adverse reactions to medications - damage to eyes, teeth, lips or other oral mucosa - nerve damage due to  positioning  - sore throat or hoarseness - damage to heart, brain, nerves, lungs, other parts of body or loss of life  Informed patient about role of CRNA in peri- and intra-operative care.  Patient voiced understanding.)        Anesthesia Quick Evaluation

## 2022-05-18 NOTE — Anesthesia Procedure Notes (Signed)
Date/Time: 05/18/2022 1:03 PM  Performed by: Johnna Acosta, CRNAPre-anesthesia Checklist: Patient identified, Emergency Drugs available, Suction available, Patient being monitored and Timeout performed Patient Re-evaluated:Patient Re-evaluated prior to induction Oxygen Delivery Method: Nasal cannula Preoxygenation: Pre-oxygenation with 100% oxygen Induction Type: IV induction

## 2022-05-18 NOTE — Anesthesia Postprocedure Evaluation (Signed)
Anesthesia Post Note  Patient: Traci Reyes  Procedure(s) Performed: COLONOSCOPY WITH PROPOFOL  Patient location during evaluation: Endoscopy Anesthesia Type: General Level of consciousness: awake and alert Pain management: pain level controlled Vital Signs Assessment: post-procedure vital signs reviewed and stable Respiratory status: spontaneous breathing, nonlabored ventilation, respiratory function stable and patient connected to nasal cannula oxygen Cardiovascular status: blood pressure returned to baseline and stable Postop Assessment: no apparent nausea or vomiting Anesthetic complications: no   No notable events documented.   Last Vitals:  Vitals:   05/18/22 1400 05/18/22 1402  BP:  (!) 156/80  Pulse:  (!) 51  Resp: 16 (!) 21  Temp:    SpO2: 100% 100%    Last Pain:  Vitals:   05/18/22 1338  TempSrc: Temporal  PainSc:                  Precious Haws Sharron Petruska

## 2022-05-18 NOTE — Op Note (Signed)
[facility] Patient Name: Traci Reyes Procedure Date: 05/18/2022 11:24 AM MRN: [id number] Account #: [id number] Date of Birth: 1946/10/31 Admit Type: Outpatient Age: 76 Room: [facility] ENDO ROOM 3 Gender: Female Note Status: Finalized Instrument Name: Park Meo [id number] Procedure:             Colonoscopy Indications:           Surveillance: Personal history of colonic polyps                         (unknown histology) on last colonoscopy 5 years ago Providers:             Andrey Farmer MD, MD Referring MD:          Youlanda Roys. Lovie Macadamia, MD (Referring MD) Medicines:             Monitored Anesthesia Care Complications:         No immediate complications. Estimated blood loss:                         Minimal. Procedure:             Pre-Anesthesia Assessment:                        - Prior to the procedure, a History and Physical was                         performed, and patient medications and allergies were                         reviewed. The patient is competent. The risks and                         benefits of the procedure and the sedation options and                         risks were discussed with the patient. All questions                         were answered and informed consent was obtained.                         Patient identification and proposed procedure were                         verified by the physician, the nurse, the                         anesthesiologist, the anesthetist and the technician                         in the endoscopy suite. Mental Status Examination:                         alert and oriented. Airway Examination: normal                         oropharyngeal airway and neck mobility. Respiratory  Examination: clear to auscultation. CV Examination:                         normal. Prophylactic Antibiotics: The patient does not                         require prophylactic antibiotics.  Prior                         Anticoagulants: The patient has taken no previous                         anticoagulant or antiplatelet agents. ASA Grade                         Assessment: II - A patient with mild systemic disease.                         After reviewing the risks and benefits, the patient                         was deemed in satisfactory condition to undergo the                         procedure. The anesthesia plan was to use monitored                         anesthesia care (MAC). Immediately prior to                         administration of medications, the patient was                         re-assessed for adequacy to receive sedatives. The                         heart rate, respiratory rate, oxygen saturations,                         blood pressure, adequacy of pulmonary ventilation, and                         response to care were monitored throughout the                         procedure. The physical status of the patient was                         re-assessed after the procedure.                        After obtaining informed consent, the colonoscope was                         passed under direct vision. Throughout the procedure,                         the patient's blood pressure, pulse, and oxygen  saturations were monitored continuously. The                         Colonoscope was introduced through the anus and                         advanced to the the cecum, identified by appendiceal                         orifice and ileocecal valve. The colonoscopy was                         technically difficult and complex due to a redundant                         colon and significant looping. Successful completion                         of the procedure was aided by applying abdominal                         pressure. The patient tolerated the procedure well.                         The quality of the bowel preparation was  good. Findings:      The perianal and digital rectal examinations were normal.      A 1 mm polyp was found in the hepatic flexure. The polyp was sessile.       The polyp was removed with a jumbo cold forceps. Resection and retrieval       were complete. Estimated blood loss was minimal.      Internal hemorrhoids were found during retroflexion. The hemorrhoids       were Grade I (internal hemorrhoids that do not prolapse).      The exam was otherwise without abnormality on direct and retroflexion       views. Impression:            - One 1 mm polyp at the hepatic flexure, removed with                         a jumbo cold forceps. Resected and retrieved.                        - Internal hemorrhoids.                        - The examination was otherwise normal on direct and                         retroflexion views. Recommendation:        - Discharge patient to home.                        - Resume previous diet.                        - Continue present medications.                        -  Await pathology results.                        - Repeat colonoscopy is not recommended due to current                         age (81 years or older) for surveillance.                        - Return to referring physician as previously                         scheduled. Procedure Code(s):     --- Professional ---                        [phone], Colonoscopy, flexible; with biopsy, single or                         multiple Diagnosis Code(s):     --- Professional ---                        Z86.010, Personal history of colonic polyps                        K63.5, Polyp of colon                        K64.0, First degree hemorrhoids CPT copyright 2019 American Medical Association. All rights reserved. The codes documented in this report are preliminary and upon coder review may  be revised to meet current compliance requirements. Andrey Farmer MD, MD 05/18/2022 1:39:30 PM Number of Addenda:  0 Note Initiated On: 05/18/2022 11:24 AM Scope Withdrawal Time: 0 hours 7 minutes 42 seconds  Total Procedure Duration: 0 hours 19 minutes 39 seconds  Estimated Blood Loss:  Estimated blood loss was minimal.      [facility]

## 2022-05-18 NOTE — Interval H&P Note (Signed)
History and Physical Interval Note:  05/18/2022 12:59 PM  Traci Reyes  has presented today for surgery, with the diagnosis of HX OF ADENOMATOUS POLYP OF COLON.  The various methods of treatment have been discussed with the patient and family. After consideration of risks, benefits and other options for treatment, the patient has consented to  Procedure(s): COLONOSCOPY WITH PROPOFOL (N/A) as a surgical intervention.  The patient's history has been reviewed, patient examined, no change in status, stable for surgery.  I have reviewed the patient's chart and labs.  Questions were answered to the patient's satisfaction.     Lesly Rubenstein  Ok to proceed with colonoscopy

## 2022-05-21 ENCOUNTER — Encounter: Payer: Self-pay | Admitting: Gastroenterology

## 2022-05-21 LAB — SURGICAL PATHOLOGY

## 2022-06-12 IMAGING — MG MM DIGITAL SCREENING BILAT W/ TOMO AND CAD
6 of 10 series · 6 of 30 positions shown · non-contrast
Comparison: Previous exam(s).

CLINICAL DATA: Screening.

EXAM:
DIGITAL SCREENING BILATERAL MAMMOGRAM WITH TOMOSYNTHESIS AND CAD
TECHNIQUE: Bilateral screening digital craniocaudal and mediolateral oblique
mammograms were obtained. Bilateral screening digital breast
tomosynthesis was performed. The images were evaluated with
computer-aided detection.

[L MLO synth-2D (1 of 2)]
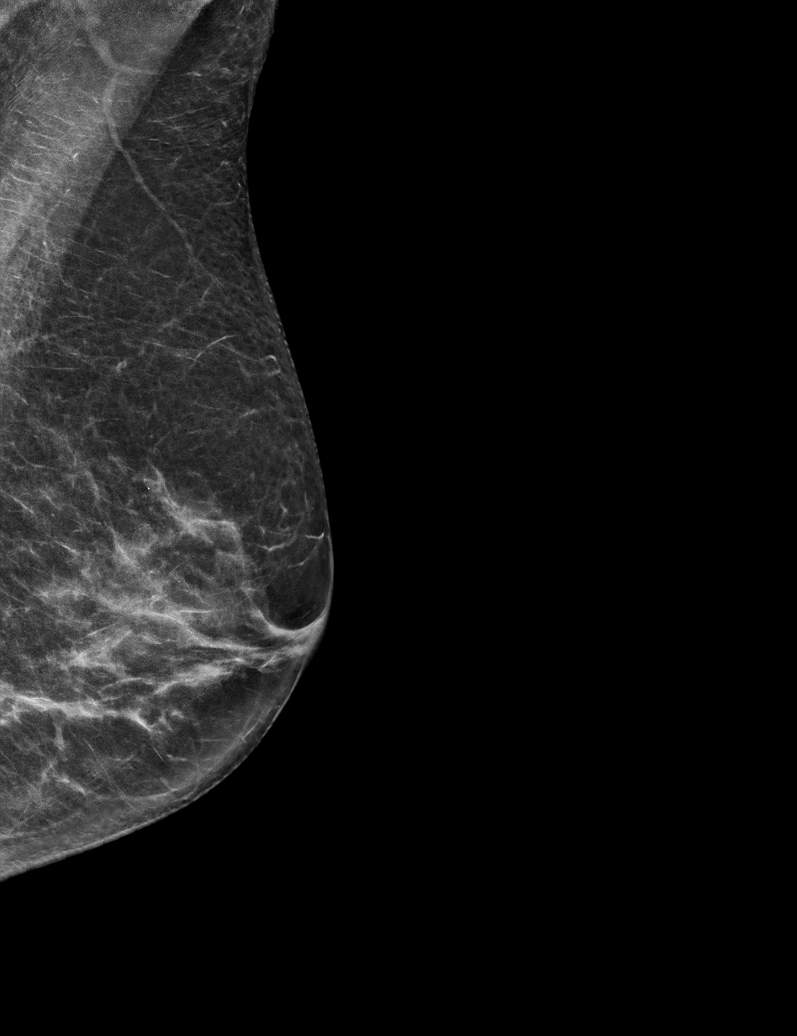

[R MLO synth-2D]
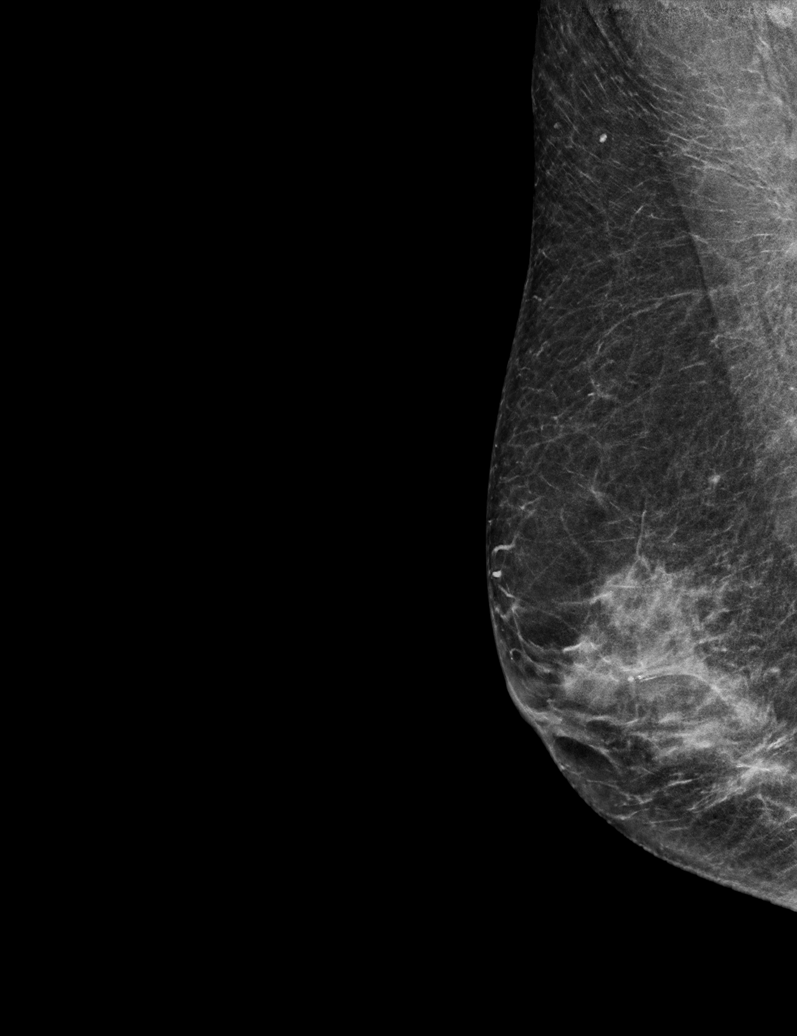

[L CC synth-2D]
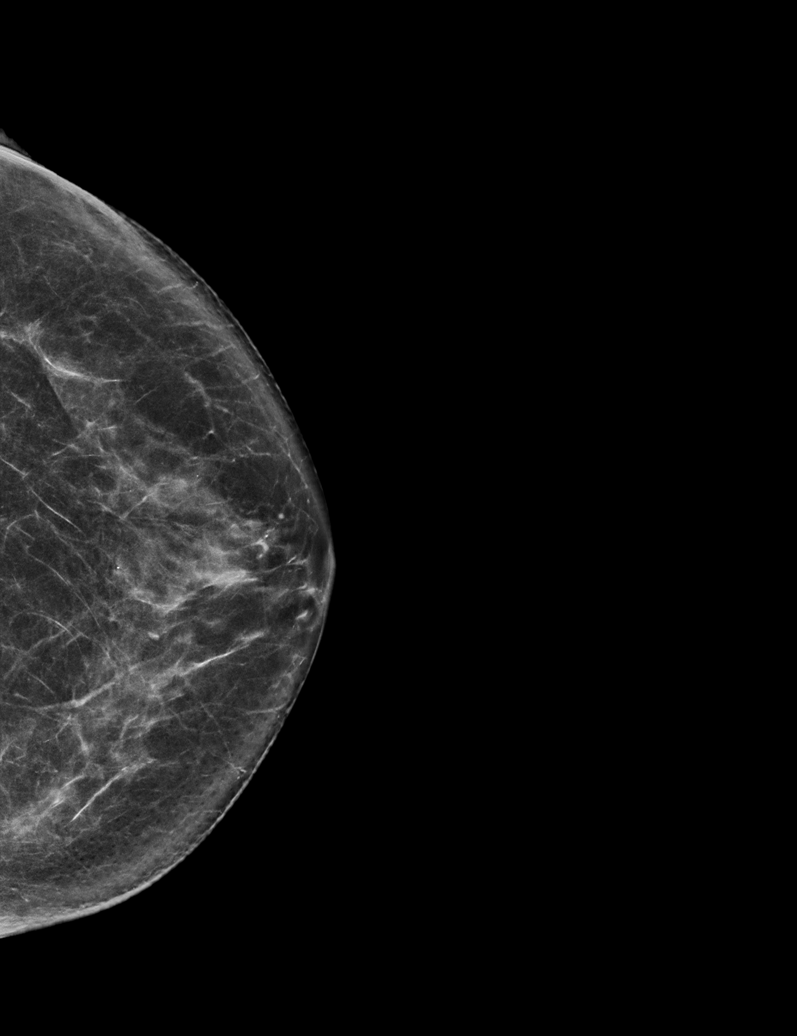

[L MLO synth-2D (2 of 2)]
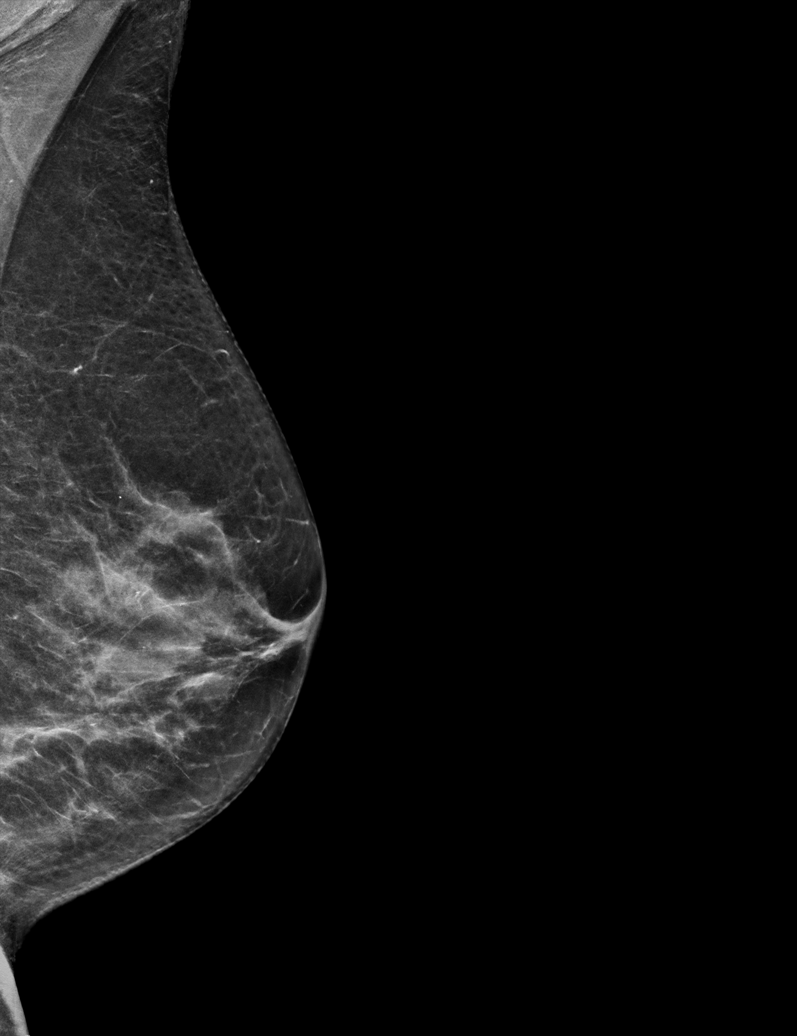

[R CC synth-2D]
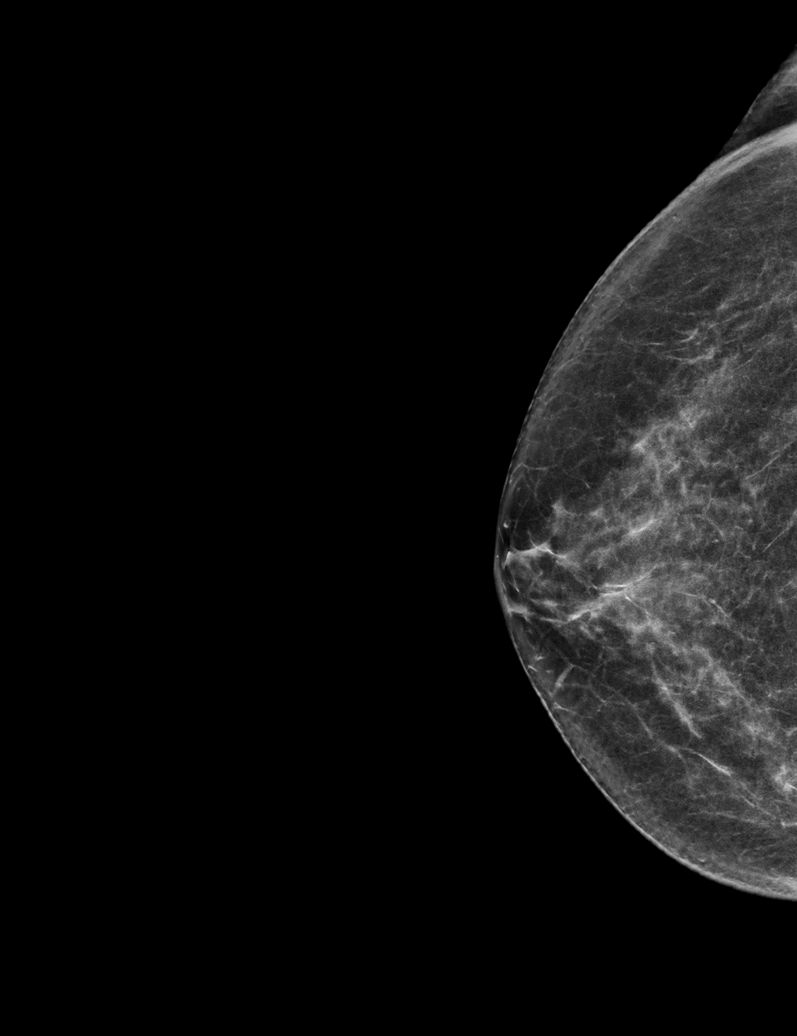

[L CC tomo · tomo slice 37/72.0]
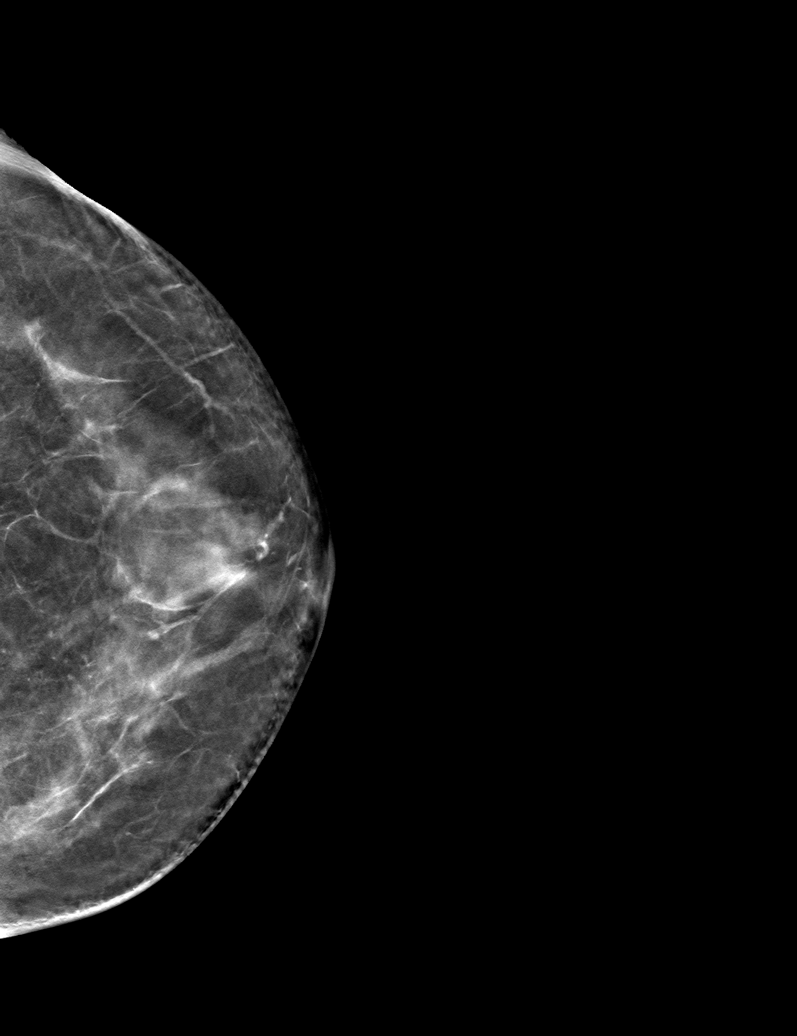

[6 of 30 positions shown; findings below may reference images not displayed]

ACR Breast Density Category c: The breast tissue is heterogeneously
dense, which may obscure small masses.
FINDINGS: There are no findings suspicious for malignancy.
IMPRESSION: No mammographic evidence of malignancy. A result letter of this
screening mammogram will be mailed directly to the patient.

RECOMMENDATION:
Screening mammogram in one year. (Code:[id number]-BC3)

BI-RADS CATEGORY  1: Negative.

## 2022-10-02 ENCOUNTER — Other Ambulatory Visit: Payer: Self-pay | Admitting: Family Medicine

## 2022-10-02 DIAGNOSIS — Z1231 Encounter for screening mammogram for malignant neoplasm of breast: Secondary | ICD-10-CM

## 2022-11-02 ENCOUNTER — Ambulatory Visit
Admission: RE | Admit: 2022-11-02 | Discharge: 2022-11-02 | Disposition: A | Discharge status: Home / Self Care | Payer: Medicare Other | Source: Ambulatory Visit | Attending: Family Medicine | Admitting: Family Medicine

## 2022-11-02 DIAGNOSIS — Z1231 Encounter for screening mammogram for malignant neoplasm of breast: Secondary | ICD-10-CM | POA: Insufficient documentation

## 2023-09-26 ENCOUNTER — Other Ambulatory Visit: Payer: Self-pay | Admitting: Family Medicine

## 2023-09-26 DIAGNOSIS — Z1231 Encounter for screening mammogram for malignant neoplasm of breast: Secondary | ICD-10-CM

## 2023-11-04 ENCOUNTER — Ambulatory Visit
Admission: RE | Admit: 2023-11-04 | Discharge: 2023-11-04 | Disposition: A | Discharge status: Home / Self Care | Payer: Medicare Other | Source: Ambulatory Visit | Attending: Family Medicine | Admitting: Family Medicine

## 2023-11-04 DIAGNOSIS — Z1231 Encounter for screening mammogram for malignant neoplasm of breast: Secondary | ICD-10-CM | POA: Insufficient documentation

## 2024-08-13 ENCOUNTER — Encounter: Payer: Self-pay | Admitting: Dermatology

## 2024-08-13 ENCOUNTER — Ambulatory Visit: Admitting: Dermatology

## 2024-08-13 DIAGNOSIS — L578 Other skin changes due to chronic exposure to nonionizing radiation: Secondary | ICD-10-CM

## 2024-08-13 DIAGNOSIS — Z1283 Encounter for screening for malignant neoplasm of skin: Secondary | ICD-10-CM | POA: Diagnosis not present

## 2024-08-13 DIAGNOSIS — D1801 Hemangioma of skin and subcutaneous tissue: Secondary | ICD-10-CM

## 2024-08-13 DIAGNOSIS — W908XXA Exposure to other nonionizing radiation, initial encounter: Secondary | ICD-10-CM

## 2024-08-13 DIAGNOSIS — L821 Other seborrheic keratosis: Secondary | ICD-10-CM

## 2024-08-13 DIAGNOSIS — L814 Other melanin hyperpigmentation: Secondary | ICD-10-CM

## 2024-08-13 DIAGNOSIS — Z808 Family history of malignant neoplasm of other organs or systems: Secondary | ICD-10-CM

## 2024-08-13 DIAGNOSIS — D229 Melanocytic nevi, unspecified: Secondary | ICD-10-CM

## 2024-08-13 NOTE — Patient Instructions (Addendum)
 Seborrheic Keratosis  What causes seborrheic keratoses? Seborrheic keratoses are harmless, common skin growths that first appear during adult life.  As time goes by, more growths appear.  Some people may develop a large number of them.  Seborrheic keratoses appear on both covered and uncovered body parts.  They are not caused by sunlight.  The tendency to develop seborrheic keratoses can be inherited.  They vary in color from skin-colored to gray, brown, or even black.  They can be either smooth or have a rough, warty surface.   Seborrheic keratoses are superficial and look as if they were stuck on the skin.  Under the microscope this type of keratosis looks like layers upon layers of skin.  That is why at times the top layer may seem to fall off, but the rest of the growth remains and re-grows.    Treatment Seborrheic keratoses do not need to be treated, but can easily be removed in the office.  Seborrheic keratoses often cause symptoms when they rub on clothing or jewelry.  Lesions can be in the way of shaving.  If they become inflamed, they can cause itching, soreness, or burning.  Removal of a seborrheic keratosis can be accomplished by freezing, burning, or surgery. If any spot bleeds, scabs, or grows rapidly, please return to have it checked, as these can be an indication of a skin cancer.  Recommend daily broad spectrum sunscreen SPF 30+ to sun-exposed areas, reapply every 2 hours as needed. Call for new or changing lesions.  Staying in the shade or wearing long sleeves, sun glasses (UVA+UVB protection) and wide brim hats (4-inch brim around the entire circumference of the hat) are also recommended for sun protection.   Melanoma ABCDEs  Melanoma is the most dangerous type of skin cancer, and is the leading cause of death from skin disease.  You are more likely to develop melanoma if you: Have light-colored skin, light-colored eyes, or red or blond hair Spend a lot of time in the sun Tan  regularly, either outdoors or in a tanning bed Have had blistering sunburns, especially during childhood Have a close family member who has had a melanoma Have atypical moles or large birthmarks  Early detection of melanoma is key since treatment is typically straightforward and cure rates are extremely high if we catch it early.   The first sign of melanoma is often a change in a mole or a new dark spot.  The ABCDE system is a way of remembering the signs of melanoma.  A for asymmetry:  The two halves do not match. B for border:  The edges of the growth are irregular. C for color:  A mixture of colors are present instead of an even brown color. D for diameter:  Melanomas are usually (but not always) greater than 6mm - the size of a pencil eraser. E for evolution:  The spot keeps changing in size, shape, and color.  Please check your skin once per month between visits. You can use a small mirror in front and a large mirror behind you to keep an eye on the back side or your body.   If you see any new or changing lesions before your next follow-up, please call to schedule a visit.  Please continue daily skin protection including broad spectrum sunscreen SPF 30+ to sun-exposed areas, reapplying every 2 hours as needed when you're outdoors.   Staying in the shade or wearing long sleeves, sun glasses (UVA+UVB protection) and wide brim hats (  4-inch brim around the entire circumference of the hat) are also recommended for sun protection.    Due to recent changes in healthcare laws, you may see results of your pathology and/or laboratory studies on MyChart before the doctors have had a chance to review them. We understand that in some cases there may be results that are confusing or concerning to you. Please understand that not all results are received at the same time and often the doctors may need to interpret multiple results in order to provide you with the best plan of care or course of  treatment. Therefore, we ask that you please give us  2 business days to thoroughly review all your results before contacting the office for clarification. Should we see a critical lab result, you will be contacted sooner.   If You Need Anything After Your Visit  If you have any questions or concerns for your doctor, please call our main line at [phone] and press option 4 to reach your doctor's medical assistant. If no one answers, please leave a voicemail as directed and we will return your call as soon as possible. Messages left after 4 pm will be answered the following business day.   You may also send us  a message via MyChart. We typically respond to MyChart messages within 1-2 business days.  For prescription refills, please ask your pharmacy to contact our office. Our fax number is [phone].  If you have an urgent issue when the clinic is closed that cannot wait until the next business day, you can page your doctor at the number below.    Please note that while we do our best to be available for urgent issues outside of office hours, we are not available 24/7.   If you have an urgent issue and are unable to reach us , you may choose to seek medical care at your doctor's office, retail clinic, urgent care center, or emergency room.  If you have a medical emergency, please immediately call 911 or go to the emergency department.  Pager Numbers  - Dr. Hester: [phone]  - Dr. Jackquline: [phone]  - Dr. Claudene: [phone]   - Dr. Raymund: [phone]  In the event of inclement weather, please call our main line at [phone] for an update on the status of any delays or closures.  Dermatology Medication Tips: Please keep the boxes that topical medications come in in order to help keep track of the instructions about where and how to use these. Pharmacies typically print the medication instructions only on the boxes and not directly on the medication tubes.   If your  medication is too expensive, please contact our office at [phone] option 4 or send us  a message through MyChart.   We are unable to tell what your co-pay for medications will be in advance as this is different depending on your insurance coverage. However, we may be able to find a substitute medication at lower cost or fill out paperwork to get insurance to cover a needed medication.   If a prior authorization is required to get your medication covered by your insurance company, please allow us  1-2 business days to complete this process.  Drug prices often vary depending on where the prescription is filled and some pharmacies may offer cheaper prices.  The website www.goodrx.com contains coupons for medications through different pharmacies. The prices here do not account for what the cost may be with help from insurance (it may be cheaper with your insurance), but the  website can give you the price if you did not use any insurance.  - You can print the associated coupon and take it with your prescription to the pharmacy.  - You may also stop by our office during regular business hours and pick up a GoodRx coupon card.  - If you need your prescription sent electronically to a different pharmacy, notify our office through [address] or by phone at [phone] option 4.     Si Usted Necesita Algo Despus de Su Visita  Tambin puede enviarnos un mensaje a travs de Clinical cytogeneticist. Por lo general respondemos a los mensajes de MyChart en el transcurso de 1 a 2 das hbiles.  Para renovar recetas, por favor pida a su farmacia que se ponga en contacto con nuestra oficina. Randi lakes de fax es [place] [phone].  Si tiene un asunto urgente cuando la clnica est cerrada y que no puede esperar hasta el siguiente da hbil, puede llamar/localizar a su doctor(a) al nmero que aparece a continuacin.   Por favor, tenga en cuenta que aunque hacemos todo lo posible para estar disponibles para  asuntos urgentes fuera del horario de [place], no estamos disponibles las 24 horas del da, los 7 [address]  [address] de la [place].   Si tiene un problema urgente y no puede comunicarse con nosotros, puede optar por buscar atencin mdica  en el consultorio de su doctor(a), en una clnica privada, en un centro de atencin urgente o en una sala de emergencias.  Si tiene Engineer, drilling, por favor llame inmediatamente al 911 o vaya a la sala de emergencias.  Nmeros de bper  - Dr. Hester: [phone]  - Dra. Jackquline: [phone]  - Dr. Claudene: [phone]  - Dra. Kitts: [phone]  En caso de inclemencias del [place], por favor llame a nuestra lnea principal al [phone] para una actualizacin sobre el estado de cualquier retraso o cierre.  Consejos para la medicacin en dermatologa: Por favor, guarde las cajas en las que vienen los medicamentos de uso tpico para ayudarle a seguir las instrucciones sobre dnde y cmo usarlos. Las farmacias generalmente imprimen las instrucciones del medicamento slo en las cajas y no directamente en los tubos del [place].   Si su medicamento es muy caro, por favor, pngase en contacto con landry rieger llamando al [phone] y presione la opcin 4 o envenos un mensaje a travs de Clinical cytogeneticist.   No podemos decirle cul ser su copago por los medicamentos por adelantado ya que esto es diferente dependiendo de la cobertura de su seguro. Sin embargo, es posible que podamos encontrar un medicamento sustituto a Audiological scientist un formulario para que el seguro cubra el medicamento que se considera necesario.   Si se requiere una autorizacin previa para que su compaa de seguros malta su medicamento, por favor permtanos de 1 a 2 das hbiles para completar este proceso.  Los precios de los medicamentos varan con frecuencia dependiendo del Environmental consultant de dnde se surte la receta y alguna farmacias pueden ofrecer precios ms baratos.  El sitio web  www.goodrx.com tiene cupones para medicamentos de Health and safety inspector. Los precios aqu no tienen en cuenta lo que podra costar con la ayuda del seguro (puede ser ms barato con su seguro), pero el sitio web puede darle el precio si no utiliz Tourist information centre manager.  - Puede imprimir el cupn correspondiente y llevarlo con su receta a la farmacia.  - Tambin puede pasar por nuestra oficina durante el horario de atencin regular y Librarian, academic  tarjeta de cupones de GoodRx.  - Si necesita que su receta se enve electrnicamente a una farmacia diferente, informe a nuestra oficina a travs de MyChart de [place] o por telfono llamando al [phone] y presione la opcin 4.

## 2024-08-13 NOTE — Progress Notes (Signed)
   New Patient Visit   Subjective  Pronounced Traci Reyes is a 78 y.o. female who presents for the following: Skin Cancer Screening and Full Body Skin Exam, no hx of skin cancer, daughter with hx of Melanoma  New patient referral from Dr. Alm Na.  The patient presents for Total-Body Skin Exam (TBSE) for skin cancer screening and mole check. The patient has spots, moles and lesions to be evaluated, some may be new or changing and the patient may have concern these could be cancer.    The following portions of the chart were reviewed this encounter and updated as appropriate: medications, allergies, medical history  Review of Systems:  No other skin or systemic complaints except as noted in HPI or Assessment and Plan.  Objective  Well appearing patient in no apparent distress; mood and affect are within normal limits.  A full examination was performed including scalp, head, eyes, ears, nose, lips, neck, chest, axillae, abdomen, back, buttocks, bilateral upper extremities, bilateral lower extremities, hands, feet, fingers, toes, fingernails, and toenails. All findings within normal limits unless otherwise noted below.   Relevant physical exam findings are noted in the Assessment and Plan.    Assessment & Plan   SKIN CANCER SCREENING PERFORMED TODAY.  ACTINIC DAMAGE - Chronic condition, secondary to cumulative UV/sun exposure - diffuse scaly erythematous macules with underlying dyspigmentation - Recommend daily broad spectrum sunscreen SPF 30+ to sun-exposed areas, reapply every 2 hours as needed.  - Staying in the shade or wearing long sleeves, sun glasses (UVA+UVB protection) and wide brim hats (4-inch brim around the entire circumference of the hat) are also recommended for sun protection.  - Call for new or changing lesions.  LENTIGINES, SEBORRHEIC KERATOSES, HEMANGIOMAS - Benign normal skin lesions - Benign-appearing - Call for any changes  MELANOCYTIC NEVI -  Tan-brown and/or pink-flesh-colored symmetric macules and papules - Benign appearing on exam today - Observation - Call clinic for new or changing moles - Recommend daily use of broad spectrum spf 30+ sunscreen to sun-exposed areas.   FAMILY HISTORY OF SKIN CANCER What type(s):Melanoma Who affected: Daughter     LENTIGINES   MULTIPLE BENIGN NEVI   ACTINIC ELASTOSIS   SEBORRHEIC KERATOSES   CHERRY ANGIOMA    Return in about 3 years (around 08/14/2027) for TBSE.  I, Grayce Saunas, RMA, am acting as scribe for Boneta Sharps, MD .   Documentation: I have reviewed the above documentation for accuracy and completeness, and I agree with the above.  Boneta Sharps, MD

## 2024-08-19 ENCOUNTER — Other Ambulatory Visit: Payer: Self-pay | Admitting: Family Medicine

## 2024-08-19 DIAGNOSIS — Z1231 Encounter for screening mammogram for malignant neoplasm of breast: Secondary | ICD-10-CM

## 2024-11-04 ENCOUNTER — Ambulatory Visit
Admission: RE | Admit: 2024-11-04 | Discharge: 2024-11-04 | Disposition: A | Discharge status: Home / Self Care | Source: Ambulatory Visit | Attending: Family Medicine | Admitting: Family Medicine

## 2024-11-04 DIAGNOSIS — Z1231 Encounter for screening mammogram for malignant neoplasm of breast: Secondary | ICD-10-CM | POA: Insufficient documentation

## 2024-11-16 ENCOUNTER — Encounter: Payer: Self-pay | Admitting: Dermatology

## 2024-11-16 ENCOUNTER — Ambulatory Visit: Admitting: Dermatology

## 2024-11-16 DIAGNOSIS — B079 Viral wart, unspecified: Secondary | ICD-10-CM | POA: Diagnosis not present

## 2024-11-16 DIAGNOSIS — L821 Other seborrheic keratosis: Secondary | ICD-10-CM

## 2024-11-16 DIAGNOSIS — Z7189 Other specified counseling: Secondary | ICD-10-CM | POA: Diagnosis not present

## 2024-11-16 DIAGNOSIS — D485 Neoplasm of uncertain behavior of skin: Secondary | ICD-10-CM | POA: Diagnosis not present

## 2024-11-16 NOTE — Progress Notes (Signed)
" ° °  Follow-Up Visit   Subjective  Pronounced Traci Reyes is a 79 y.o. female who presents for the following: spot at forehead, came up within the last month, not sore or bleeding. Spot at nose, came up with in the last month. No hx skin cancer.    The following portions of the chart were reviewed this encounter and updated as appropriate: medications, allergies, medical history  Review of Systems:  No other skin or systemic complaints except as noted in HPI or Assessment and Plan.  Objective  Well appearing patient in no apparent distress; mood and affect are within normal limits.   A focused examination was performed of the following areas: face  Relevant exam findings are noted in the Assessment and Plan.  right nose Verrucous papules -- Discussed viral etiology and contagion.  R forehead 7 mm pink hyperkeratotic papule   Assessment & Plan     VIRAL WARTS, UNSPECIFIED TYPE right nose Viral Wart (HPV) Counseling  Discussed viral / HPV (Human Papilloma Virus) etiology and risk of spread /infectivity to other areas of body as well as to other people.  Multiple treatments and methods may be required to clear warts and it is possible treatment may not be successful.  Treatment risks include discoloration; scarring and there is still potential for wart recurrence.  FROZEN AS TEST DO NOT BILL - Destruction of lesion - right nose Complexity: simple   Destruction method: cryotherapy   Informed consent: discussed and consent obtained   Timeout:  patient name, date of birth, surgical site, and procedure verified Lesion destroyed using liquid nitrogen: Yes   Region frozen until ice ball extended beyond lesion: Yes   Cryo cycles: 1 or 2. Outcome: patient tolerated procedure well with no complications   Post-procedure details: wound care instructions given    NEOPLASM OF UNCERTAIN BEHAVIOR OF SKIN R forehead - Epidermal / dermal shaving  Lesion diameter (cm):  0.7 Informed  consent: discussed and consent obtained   Timeout: patient name, date of birth, surgical site, and procedure verified   Procedure prep:  Patient was prepped and draped in usual sterile fashion Prep type:  Isopropyl alcohol Anesthesia: the lesion was anesthetized in a standard fashion   Anesthetic:  1% lidocaine  w/ epinephrine 1-100,000 buffered w/ 8.4% NaHCO3 Instrument used: DermaBlade   Hemostasis achieved with: pressure and aluminum chloride   Outcome: patient tolerated procedure well   Post-procedure details: wound care instructions given    Specimen 1 - Surgical pathology Differential Diagnosis: SK vs verruca vs SCC  Check Margins: No  No follow-ups on file.  LILLETTE Lonell Drones, RMA, am acting as scribe for Boneta Sharps, MD .   Documentation: I have reviewed the above documentation for accuracy and completeness, and I agree with the above.  Boneta Sharps, MD    "

## 2024-11-16 NOTE — Patient Instructions (Addendum)
 Biopsy Wound Care Instructions  Leave the original bandage on for 24 hours if possible.  If the bandage becomes soaked or soiled before that time, it is OK to remove it and examine the wound.  A small amount of post-operative bleeding is normal.  If excessive bleeding occurs, remove the bandage, place gauze over the site and apply continuous pressure (no peeking) over the area for 30 minutes. If this does not work, please call our clinic as soon as possible or page your doctor if it is after hours.   Once a day, cleanse the wound with soap and water. It is fine to shower. If a thick crust develops you may use a Q-tip dipped into dilute hydrogen peroxide (mix 1:1 with water) to dissolve it.  Hydrogen peroxide can slow the healing process, so use it only as needed.    After washing, apply petroleum jelly (Vaseline) or an antibiotic ointment if your doctor prescribed one for you, followed by a bandage.    For best healing, the wound should be covered with a layer of ointment at all times. If you are not able to keep the area covered with a bandage to hold the ointment in place, this may mean re-applying the ointment several times a day.  Continue this wound care until the wound has healed and is no longer open.   Itching and mild discomfort is normal during the healing process. However, if you develop pain or severe itching, please call our office.   If you have any discomfort, you can take Tylenol  (acetaminophen ) or ibuprofen as directed on the bottle. (Please do not take these if you have an allergy to them or cannot take them for another reason).  Some redness, tenderness and white or yellow material in the wound is normal healing.  If the area becomes very sore and red, or develops a thick yellow-green material (pus), it may be infected; please notify us .    If you have stitches, return to clinic as directed to have the stitches removed. You will continue wound care for 2-3 days after the stitches  are removed.   Wound healing continues for up to one year following surgery. It is not unusual to experience pain in the scar from time to time during the interval.  If the pain becomes severe or the scar thickens, you should notify the office.    A slight amount of redness in a scar is expected for the first six months.  After six months, the redness will fade and the scar will soften and fade.  The color difference becomes less noticeable with time.  If there are any problems, return for a post-op surgery check at your earliest convenience.  To improve the appearance of the scar, you can use silicone scar gel, cream, or sheets (such as Mederma or Serica) every night for up to one year. These are available over the counter (without a prescription).  Please call our office at [phone] for any questions or concerns.      Due to recent changes in healthcare laws, you may see results of your pathology and/or laboratory studies on MyChart before the doctors have had a chance to review them. We understand that in some cases there may be results that are confusing or concerning to you. Please understand that not all results are received at the same time and often the doctors may need to interpret multiple results in order to provide you with the best plan of care or  course of treatment. Therefore, we ask that you please give us  2 business days to thoroughly review all your results before contacting the office for clarification. Should we see a critical lab result, you will be contacted sooner.   If You Need Anything After Your Visit  If you have any questions or concerns for your doctor, please call our main line at [phone] and press option 4 to reach your doctor's medical assistant. If no one answers, please leave a voicemail as directed and we will return your call as soon as possible. Messages left after 4 pm will be answered the following business day.   You may also send us  a message  via MyChart. We typically respond to MyChart messages within 1-2 business days.  For prescription refills, please ask your pharmacy to contact our office. Our fax number is [phone].  If you have an urgent issue when the clinic is closed that cannot wait until the next business day, you can page your doctor at the number below.    Please note that while we do our best to be available for urgent issues outside of office hours, we are not available 24/7.   If you have an urgent issue and are unable to reach us , you may choose to seek medical care at your doctor's office, retail clinic, urgent care center, or emergency room.  If you have a medical emergency, please immediately call 911 or go to the emergency department.  Pager Numbers  - Dr. Hester: [phone]  - Dr. Jackquline: [phone]  - Dr. Claudene: [phone]   - Dr. Raymund: [phone]  In the event of inclement weather, please call our main line at [phone] for an update on the status of any delays or closures.  Dermatology Medication Tips: Please keep the boxes that topical medications come in in order to help keep track of the instructions about where and how to use these. Pharmacies typically print the medication instructions only on the boxes and not directly on the medication tubes.   If your medication is too expensive, please contact our office at [phone] option 4 or send us  a message through MyChart.   We are unable to tell what your co-pay for medications will be in advance as this is different depending on your insurance coverage. However, we may be able to find a substitute medication at lower cost or fill out paperwork to get insurance to cover a needed medication.   If a prior authorization is required to get your medication covered by your insurance company, please allow us  1-2 business days to complete this process.  Drug prices often vary depending on where the prescription is filled and some  pharmacies may offer cheaper prices.  The website www.goodrx.com contains coupons for medications through different pharmacies. The prices here do not account for what the cost may be with help from insurance (it may be cheaper with your insurance), but the website can give you the price if you did not use any insurance.  - You can print the associated coupon and take it with your prescription to the pharmacy.  - You may also stop by our office during regular business hours and pick up a GoodRx coupon card.  - If you need your prescription sent electronically to a different pharmacy, notify our office through [facility] or by phone at [phone] option 4.     Si Usted Necesita Algo Despus de Su Visita  Tambin puede enviarnos un mensaje a travs de  MyChart. Por lo general respondemos a los mensajes de MyChart en el transcurso de 1 a 2 das hbiles.  Para renovar recetas, por favor pida a su farmacia que se ponga en contacto con nuestra oficina. Randi lakes de fax es [place] [phone].  Si tiene un asunto urgente cuando la clnica est cerrada y que no puede esperar hasta el siguiente da hbil, puede llamar/localizar a su doctor(a) al nmero que aparece a continuacin.   Por favor, tenga en cuenta que aunque hacemos todo lo posible para estar disponibles para asuntos urgentes fuera del horario de [place], no estamos disponibles las 24 horas del da, los 7 [address]  [address] de la [place].   Si tiene un problema urgente y no puede comunicarse con nosotros, puede optar por buscar atencin mdica  en el consultorio de su doctor(a), en una clnica privada, en un centro de atencin urgente o en una sala de emergencias.  Si tiene Engineer, drilling, por favor llame inmediatamente al 911 o vaya a la sala de emergencias.  Nmeros de bper  - Dr. Hester: [phone]  - Dra. Jackquline: [phone]  - Dr. Claudene: [phone]  - Dra. Kitts: [phone]  En caso de inclemencias del [place],  por favor llame a nuestra lnea principal al [phone] para una actualizacin sobre el estado de cualquier retraso o cierre.  Consejos para la medicacin en dermatologa: Por favor, guarde las cajas en las que vienen los medicamentos de uso tpico para ayudarle a seguir las instrucciones sobre dnde y cmo usarlos. Las farmacias generalmente imprimen las instrucciones del medicamento slo en las cajas y no directamente en los tubos del [place].   Si su medicamento es muy caro, por favor, pngase en contacto con landry rieger llamando al [phone] y presione la opcin 4 o envenos un mensaje a travs de Clinical cytogeneticist.   No podemos decirle cul ser su copago por los medicamentos por adelantado ya que esto es diferente dependiendo de la cobertura de su seguro. Sin embargo, es posible que podamos encontrar un medicamento sustituto a Audiological scientist un formulario para que el seguro cubra el medicamento que se considera necesario.   Si se requiere una autorizacin previa para que su compaa de seguros malta su medicamento, por favor permtanos de 1 a 2 das hbiles para completar este proceso.  Los precios de los medicamentos varan con frecuencia dependiendo del Environmental consultant de dnde se surte la receta y alguna farmacias pueden ofrecer precios ms baratos.  El sitio web www.goodrx.com tiene cupones para medicamentos de Health and safety inspector. Los precios aqu no tienen en cuenta lo que podra costar con la ayuda del seguro (puede ser ms barato con su seguro), pero el sitio web puede darle el precio si no utiliz Tourist information centre manager.  - Puede imprimir el cupn correspondiente y llevarlo con su receta a la farmacia.  - Tambin puede pasar por nuestra oficina durante el horario de atencin regular y Education officer, museum una tarjeta de cupones de GoodRx.  - Si necesita que su receta se enve electrnicamente a una farmacia diferente, informe a nuestra oficina a travs de MyChart de [place] o por telfono llamando al  [phone] y presione la opcin 4.

## 2024-11-17 ENCOUNTER — Ambulatory Visit: Payer: Self-pay | Admitting: Dermatology

## 2024-11-17 LAB — SURGICAL PATHOLOGY

## 2024-11-18 NOTE — Telephone Encounter (Signed)
-----   Message from Boneta Sharps, MD sent at 11/17/2024  5:47 PM EST ----- Diagnosis: R forehead :       SEBORRHEIC KERATOSIS, IRRITATED, INFLAMED, BASE INVOLVED   Plan: none, sent mychart message

## 2024-11-18 NOTE — Telephone Encounter (Signed)
 Patient notified, Dr. Claudene sent MyChart message to patient.
